# Patient Record
Sex: Female | Born: 1985 | Race: Black or African American | Hispanic: No | Marital: Single | Smoking: Never smoker
Health system: Southern US, Community
[De-identification: ages and names within clinical notes are randomized; demographics above are authoritative.]

## PROBLEM LIST (undated history)

## (undated) DIAGNOSIS — IMO0002 Reserved for concepts with insufficient information to code with codable children: Secondary | ICD-10-CM

## (undated) DIAGNOSIS — M329 Systemic lupus erythematosus, unspecified: Secondary | ICD-10-CM

---

## 2015-11-13 ENCOUNTER — Emergency Department (HOSPITAL_COMMUNITY): Payer: Managed Care, Other (non HMO)

## 2015-11-13 ENCOUNTER — Emergency Department (HOSPITAL_COMMUNITY)
Admission: EM | Admit: 2015-11-13 | Discharge: 2015-11-13 | Disposition: A | Discharge status: Home / Self Care | Payer: Managed Care, Other (non HMO) | Attending: Physician Assistant | Admitting: Physician Assistant

## 2015-11-13 ENCOUNTER — Encounter (HOSPITAL_COMMUNITY): Payer: Self-pay | Admitting: Nurse Practitioner

## 2015-11-13 DIAGNOSIS — Z3202 Encounter for pregnancy test, result negative: Secondary | ICD-10-CM | POA: Diagnosis not present

## 2015-11-13 DIAGNOSIS — R1011 Right upper quadrant pain: Secondary | ICD-10-CM | POA: Diagnosis not present

## 2015-11-13 DIAGNOSIS — R011 Cardiac murmur, unspecified: Secondary | ICD-10-CM | POA: Diagnosis not present

## 2015-11-13 DIAGNOSIS — R110 Nausea: Secondary | ICD-10-CM | POA: Insufficient documentation

## 2015-11-13 DIAGNOSIS — Z8739 Personal history of other diseases of the musculoskeletal system and connective tissue: Secondary | ICD-10-CM | POA: Insufficient documentation

## 2015-11-13 HISTORY — DX: Systemic lupus erythematosus, unspecified: M32.9

## 2015-11-13 HISTORY — DX: Reserved for concepts with insufficient information to code with codable children: IMO0002

## 2015-11-13 LAB — COMPREHENSIVE METABOLIC PANEL
ALT: 17 U/L (ref 14–54)
AST: 20 U/L (ref 15–41)
Albumin: 2.7 g/dL — ABNORMAL LOW (ref 3.5–5.0)
Alkaline Phosphatase: 88 U/L (ref 38–126)
Anion gap: 10 (ref 5–15)
BUN: 8 mg/dL (ref 6–20)
CALCIUM: 8.9 mg/dL (ref 8.9–10.3)
CHLORIDE: 102 mmol/L (ref 101–111)
CO2: 27 mmol/L (ref 22–32)
CREATININE: 0.81 mg/dL (ref 0.44–1.00)
Glucose, Bld: 98 mg/dL (ref 65–99)
Potassium: 3.6 mmol/L (ref 3.5–5.1)
Sodium: 139 mmol/L (ref 135–145)
Total Bilirubin: 0.4 mg/dL (ref 0.3–1.2)
Total Protein: 7.8 g/dL (ref 6.5–8.1)

## 2015-11-13 LAB — URINALYSIS, ROUTINE W REFLEX MICROSCOPIC
Bilirubin Urine: NEGATIVE
Glucose, UA: NEGATIVE mg/dL
Hgb urine dipstick: NEGATIVE
Ketones, ur: NEGATIVE mg/dL
Nitrite: NEGATIVE
PROTEIN: 30 mg/dL — AB
Specific Gravity, Urine: 1.016 (ref 1.005–1.030)
pH: 5 (ref 5.0–8.0)

## 2015-11-13 LAB — CBC
HCT: 27.4 % — ABNORMAL LOW (ref 36.0–46.0)
Hemoglobin: 8.8 g/dL — ABNORMAL LOW (ref 12.0–15.0)
MCH: 21.8 pg — ABNORMAL LOW (ref 26.0–34.0)
MCHC: 32.1 g/dL (ref 30.0–36.0)
MCV: 68 fL — AB (ref 78.0–100.0)
PLATELETS: 596 10*3/uL — AB (ref 150–400)
RBC: 4.03 MIL/uL (ref 3.87–5.11)
RDW: 16.4 % — ABNORMAL HIGH (ref 11.5–15.5)
WBC: 6.3 10*3/uL (ref 4.0–10.5)

## 2015-11-13 LAB — URINE MICROSCOPIC-ADD ON
RBC / HPF: NONE SEEN RBC/hpf (ref 0–5)
SQUAMOUS EPITHELIAL / LPF: NONE SEEN

## 2015-11-13 LAB — I-STAT BETA HCG BLOOD, ED (MC, WL, AP ONLY)

## 2015-11-13 MED ORDER — OXYCODONE-ACETAMINOPHEN 5-325 MG PO TABS
1.0000 | ORAL_TABLET | Freq: Once | ORAL | Status: AC
  Administered 2015-11-13: 1 via ORAL
  Filled 2015-11-13: qty 1

## 2015-11-13 MED ORDER — ONDANSETRON HCL 4 MG PO TABS: 4.0000 mg | ORAL_TABLET | Freq: Three times a day (TID) | ORAL | Status: DC | PRN

## 2015-11-13 MED ORDER — CEPHALEXIN 500 MG PO CAPS: 500.0000 mg | ORAL_CAPSULE | Freq: Four times a day (QID) | ORAL | Status: DC

## 2015-11-13 MED ORDER — OXYCODONE-ACETAMINOPHEN 5-325 MG PO TABS: 1.0000 | ORAL_TABLET | Freq: Four times a day (QID) | ORAL | Status: DC | PRN

## 2015-11-13 MED ORDER — CEPHALEXIN 250 MG PO CAPS
500.0000 mg | ORAL_CAPSULE | Freq: Once | ORAL | Status: AC
  Administered 2015-11-13: 500 mg via ORAL
  Filled 2015-11-13: qty 2

## 2015-11-13 NOTE — ED Notes (Signed)
Pt sent here from urgent care dx: with UTI.

## 2015-11-13 NOTE — ED Notes (Signed)
She c/o constant R sided abd pain since Thursday with intermittent nausea. Denies fevers, vomiting, bowel/bladder changes. Pain aggravated with movement, palpation. Pain decreased with sitting still. She is A&Ox4, resp e/u

## 2015-11-13 NOTE — Discharge Instructions (Signed)
You do have evidence of a UTI which we will treat today.    You were found to have gallbladder sludge.  No infection or stones. Please return if you have a fever, increased pain.  Call central surgery to follow up.    Please follow up with your PCP as well.

## 2015-11-13 NOTE — ED Provider Notes (Signed)
CSN: [id number]     Arrival date & time 11/13/15  1454 History   First MD Initiated Contact with Patient 11/13/15 1723     Chief Complaint  Patient presents with  . Abdominal Pain     (Consider location/radiation/quality/duration/timing/severity/associated sxs/prior Treatment) HPI   Patient's very friendly 30 year old female with history of lupus on plaque on  presenting with right upper quadrant pain. Patient reports that she's had decreased appetite for the last 2 weeks, increased nausea for last week, and right upper quadrant pain for last 2 days. She went to urgent care center here for an ultrasound of her right upper quadrant.  Patient has not found either eating or not eating helpful. Patient does note that the pain is worse with cough or movement. She reports she is trying to stay stills that she doesn't have to move her muscles which causes her pain. In addition it hurts her when she coughs  Past Medical History  Diagnosis Date  . Lupus ([place])    History reviewed. No pertinent past surgical history. History reviewed. No pertinent family history. Social History  Substance Use Topics  . Smoking status: Never Smoker   . Smokeless tobacco: None  . Alcohol Use: Yes   OB History    No data available     Review of Systems  Constitutional: Negative for fever and activity change.  Respiratory: Negative for shortness of breath.   Cardiovascular: Negative for chest pain.  Gastrointestinal: Positive for nausea and abdominal pain. Negative for vomiting.  Genitourinary: Negative for dysuria and flank pain.  Musculoskeletal: Negative for back pain.  Neurological: Negative for dizziness.      Allergies  Review of patient's allergies indicates no known allergies.  Home Medications   Prior to Admission medications   Not on File   BP 120/80 mmHg  Pulse 100  Temp(Src) 97.9 F (36.6 C) (Oral)  Resp 18  SpO2 99%  LMP 11/09/2015 Physical Exam  Constitutional: She is  oriented to person, place, and time. She appears well-developed and well-nourished.  HENT:  Head: Normocephalic and atraumatic.  Eyes: Conjunctivae are normal. Right eye exhibits no discharge.  Neck: Neck supple.  Cardiovascular: Normal rate and regular rhythm.   Murmur heard. Pulmonary/Chest: Effort normal and breath sounds normal. She has no wheezes. She has no rales.  Abdominal: Soft. She exhibits no distension.  Right upper quadrant tenderness.  Musculoskeletal: Normal range of motion. She exhibits no edema.  Neurological: She is oriented to person, place, and time. No cranial nerve deficit.  Skin: Skin is warm and dry. No rash noted. She is not diaphoretic.  Psychiatric: She has a normal mood and affect. Her behavior is normal.  Nursing note and vitals reviewed.   ED Course  Procedures (including critical care time) Labs Review Labs Reviewed  COMPREHENSIVE METABOLIC PANEL - Abnormal; Notable for the following:    Albumin 2.7 (*)    All other components within normal limits  CBC - Abnormal; Notable for the following:    Hemoglobin 8.8 (*)    HCT 27.4 (*)    MCV 68.0 (*)    MCH 21.8 (*)    RDW 16.4 (*)    Platelets 596 (*)    All other components within normal limits  URINALYSIS, ROUTINE W REFLEX MICROSCOPIC (NOT AT [facility]) - Abnormal; Notable for the following:    APPearance CLOUDY (*)    Protein, ur 30 (*)    Leukocytes, UA MODERATE (*)    All other components within  normal limits  URINE MICROSCOPIC-ADD ON - Abnormal; Notable for the following:    Bacteria, UA FEW (*)    All other components within normal limits  I-STAT BETA HCG BLOOD, ED ([facility], [facility], [facility] ONLY)    Imaging Review No results found. I have personally reviewed and evaluated these images and lab results as part of my medical decision-making.   EKG Interpretation None      MDM   Final diagnoses:  RUQ pain    Patient is a 30 year old female lupus on plaque in the presenting with right upper quadrant  pain. Patient reports this developed in the last 3 days. She does not think it's worse or better with eating. Patient does have tenderness to the right upper quadrant on exam. She denies chest pain. The way that she describes the pain is worse with movement. We'll make sure the patient does not have occult pneumonia, and do right upper quadrant ultrasound.     EMERGENCY DEPARTMENT BILIARY ULTRASOUND INTERPRETATION "Study: Limited Abdominal Ultrasound of the gallbladder and common bile duct."  INDICATIONS: RUQ pain Indication: Multiple views of the gallbladder and common bile duct were obtained in real-time with a Multi-frequency probe." PERFORMED BY:  Myself IMAGES ARCHIVED?: Yes FINDINGS: Gallstones absent INTERPRETATION: Normal   6:44 PM Official RUQ negative. CXR negative. Labs show no infection.  UA shows UTI.  Will treat UTI and have her follow up with surgery as needed.   She is taking PO, ambulatory, normal vitals.   Mairely Foxworth Julio Alm, MD 11/13/15 [phone]

## 2015-11-13 NOTE — ED Notes (Signed)
Patient transported to Ultrasound 

## 2015-12-26 ENCOUNTER — Other Ambulatory Visit (HOSPITAL_COMMUNITY): Payer: Self-pay | Admitting: Internal Medicine

## 2015-12-26 DIAGNOSIS — R0602 Shortness of breath: Secondary | ICD-10-CM

## 2016-01-06 ENCOUNTER — Ambulatory Visit (HOSPITAL_COMMUNITY)
Admission: RE | Admit: 2016-01-06 | Discharge: 2016-01-06 | Disposition: A | Discharge status: Home / Self Care | Payer: Managed Care, Other (non HMO) | Source: Ambulatory Visit | Attending: Internal Medicine | Admitting: Internal Medicine

## 2016-01-06 DIAGNOSIS — R918 Other nonspecific abnormal finding of lung field: Secondary | ICD-10-CM | POA: Diagnosis not present

## 2016-01-06 DIAGNOSIS — J90 Pleural effusion, not elsewhere classified: Secondary | ICD-10-CM | POA: Diagnosis not present

## 2016-01-06 DIAGNOSIS — R0602 Shortness of breath: Secondary | ICD-10-CM

## 2016-01-06 DIAGNOSIS — R599 Enlarged lymph nodes, unspecified: Secondary | ICD-10-CM | POA: Insufficient documentation

## 2016-01-31 ENCOUNTER — Encounter: Payer: Self-pay | Admitting: Hematology

## 2016-02-02 ENCOUNTER — Telehealth: Payer: Self-pay | Admitting: Hematology

## 2016-02-02 NOTE — Telephone Encounter (Signed)
Patient called this morning returning a call from our office regarding an appointment. Spoke with patient re seeing [facility] 5/4 - per patient appointment moved to 5/18 (date per patient). Patient has new appointment date/time/location. Patient demographic and insurance information confirmed. Forwarded back to [facility] to close out new patient process.

## 2016-02-09 ENCOUNTER — Ambulatory Visit: Payer: Managed Care, Other (non HMO) | Admitting: Hematology

## 2016-02-23 ENCOUNTER — Ambulatory Visit (HOSPITAL_BASED_OUTPATIENT_CLINIC_OR_DEPARTMENT_OTHER): Payer: Managed Care, Other (non HMO) | Admitting: Hematology

## 2016-02-23 ENCOUNTER — Telehealth: Payer: Self-pay | Admitting: Hematology

## 2016-02-23 ENCOUNTER — Encounter: Payer: Self-pay | Admitting: Hematology

## 2016-02-23 ENCOUNTER — Other Ambulatory Visit (HOSPITAL_BASED_OUTPATIENT_CLINIC_OR_DEPARTMENT_OTHER): Payer: Managed Care, Other (non HMO)

## 2016-02-23 VITALS — BP 124/71 | HR 87 | Temp 98.2°F | Resp 16 | Ht 64.0 in | Wt 112.4 lb

## 2016-02-23 DIAGNOSIS — E538 Deficiency of other specified B group vitamins: Secondary | ICD-10-CM

## 2016-02-23 DIAGNOSIS — M329 Systemic lupus erythematosus, unspecified: Secondary | ICD-10-CM | POA: Insufficient documentation

## 2016-02-23 DIAGNOSIS — D509 Iron deficiency anemia, unspecified: Secondary | ICD-10-CM

## 2016-02-23 DIAGNOSIS — D638 Anemia in other chronic diseases classified elsewhere: Secondary | ICD-10-CM | POA: Diagnosis not present

## 2016-02-23 DIAGNOSIS — D75839 Thrombocytosis, unspecified: Secondary | ICD-10-CM

## 2016-02-23 DIAGNOSIS — D473 Essential (hemorrhagic) thrombocythemia: Secondary | ICD-10-CM

## 2016-02-23 LAB — CBC & DIFF AND RETIC
BASO%: 0.1 % (ref 0.0–2.0)
Basophils Absolute: 0 10*3/uL (ref 0.0–0.1)
EOS%: 0.1 % (ref 0.0–7.0)
Eosinophils Absolute: 0 10*3/uL (ref 0.0–0.5)
HEMATOCRIT: 38.1 % (ref 34.8–46.6)
HGB: 12.2 g/dL (ref 11.6–15.9)
Immature Retic Fract: 3.4 % (ref 1.60–10.00)
LYMPH#: 3.7 10*3/uL — AB (ref 0.9–3.3)
LYMPH%: 26.3 % (ref 14.0–49.7)
MCH: 23.9 pg — ABNORMAL LOW (ref 25.1–34.0)
MCHC: 32 g/dL (ref 31.5–36.0)
MCV: 74.6 fL — ABNORMAL LOW (ref 79.5–101.0)
MONO#: 0.8 10*3/uL (ref 0.1–0.9)
MONO%: 5.9 % (ref 0.0–14.0)
NEUT%: 67.6 % (ref 38.4–76.8)
NEUTROS ABS: 9.5 10*3/uL — AB (ref 1.5–6.5)
Platelets: 383 10*3/uL (ref 145–400)
RBC: 5.11 10*6/uL (ref 3.70–5.45)
RDW: 23.2 % — ABNORMAL HIGH (ref 11.2–14.5)
RETIC %: 1.52 % (ref 0.70–2.10)
RETIC CT ABS: 77.67 10*3/uL (ref 33.70–90.70)
WBC: 14 10*3/uL — ABNORMAL HIGH (ref 3.9–10.3)
nRBC: 0 % (ref 0–0)

## 2016-02-23 LAB — COMPREHENSIVE METABOLIC PANEL
ALT: 16 U/L (ref 0–55)
AST: 13 U/L (ref 5–34)
Albumin: 3.6 g/dL (ref 3.5–5.0)
Alkaline Phosphatase: 66 U/L (ref 40–150)
Anion Gap: 6 mEq/L (ref 3–11)
BUN: 13.7 mg/dL (ref 7.0–26.0)
CHLORIDE: 104 meq/L (ref 98–109)
CO2: 29 meq/L (ref 22–29)
CREATININE: 0.8 mg/dL (ref 0.6–1.1)
Calcium: 9.5 mg/dL (ref 8.4–10.4)
EGFR: >90 mL/min/{1.73_m2} (ref >90)
Glucose: 71 mg/dl (ref 70–140)
Potassium: 3.6 mEq/L (ref 3.5–5.1)
Sodium: 138 mEq/L (ref 136–145)
Total Bilirubin: <0.3 mg/dL (ref 0.20–1.20)
Total Protein: 7.6 g/dL (ref 6.4–8.3)

## 2016-02-23 LAB — CHCC SMEAR

## 2016-02-23 LAB — LACTATE DEHYDROGENASE: LDH: 184 U/L (ref 125–245)

## 2016-02-23 NOTE — Telephone Encounter (Signed)
Gave and printed avs °

## 2016-02-23 NOTE — Progress Notes (Signed)
[facility]    HEMATOLOGY/ONCOLOGY CONSULTATION NOTE  Date of Service: 02/23/2016  PCP: Dr. Lujean Amel MD [facility] Rheumatology: Dr. Delphia Grates at [place]:  Microcytic anemia  HISTORY OF PRESENTING ILLNESS:   Janet Pittman is a wonderful 30 y.o. female who has been referred to [place] by Dr. Delphia Grates for evaluation and management of microcytic anemia.  Patient is a Pharmacist, community with a history of SLE diagnosed in 2002. She had been on Plaquenil since initial presentation with arthralgias and rashes. Recently had developed pleurisy and has been on prednisone 40 mg by mouth daily. She was also taking ibuprofen 1 tablet 2-3 times a day for several months and stopped about a month ago. She reports that her rheumatologist is to start her on Benlysta for the treatment of her SLE soon she reports no history of lupus nephritis or significant anemia related to her SLE in the past.   she had a recent labs with her primary care physician when she presented with significant fatigue and was noted to have a hemoglobin of 7.8 with an MCV of 65 normal WBC count of 8.7 k and platelets of 662 k. Her iron saturation was noted to be 7% with a decreased total iron binding capacity suggestive of iron deficiency and likely anemia of chronic disease .   she notes that she was started on ferrous fumarate 1 tablet by mouth twice a day about a month ago and prednisone 40 mg by mouth daily about the same time and has been feeling better.  She notes no overt bleeding or abdominal pain. Bowel movements have been regular with no blood in the stools or black stools. Her periods have been borderline heavy but not concerning to her.  Previously noted to have B12 deficiency. Unclear etiology. Has been on oral B12 replacement.  She notes that a couple months ago she lost a fair amount of weight when she was feeling unwell but is starting to gain her weight back and has been feeling quite hungry  with the prednisone on board.  No other acute new focal symptoms at this time.    MEDICAL HISTORY:   #1 systemic lupus erythematosus diagnosed in 2002 was on Plaquenil treatment. He initially presented with arthralgias and rashes. Has recently developed pleurisy for which she has been on prednisone 40 mg by mouth daily for the last 1 month. She reports that her rheumatologist is going to be starting her on Benlysta. #2 history of B12 deficiency #3 iron deficiency -currently on oral ferrous fumarate 1 tablet by mouth twice a day. #4 history of vitamin D deficiency currently on ergocalciferol.  SURGICAL HISTORY: No previous surgeries  SOCIAL HISTORY: Social History   Social History  . Marital Status: Single    Spouse Name: N/A  . Number of Children: N/A  . Years of Education: N/A   Occupational History  . Not on file.   Social History Main Topics  . Smoking status: Never Smoker   . Smokeless tobacco: Not on file  . Alcohol Use: Yes  . Drug Use: No  . Sexual Activity: No   Other Topics Concern  . Not on file   Social History Narrative  Works as a Pharmacist, community in [place]. Never smoker. Minimal social alcohol use. No recreational drug use.   FAMILY HISTORY: Patient reports that she was adopted and does not know her family history  ALLERGIES:  has No Known Allergies.  MEDICATIONS:  Current Outpatient Prescriptions  Medication  Sig Dispense Refill  . Ferrous Fumarate (FERROCITE) 324 (106 Fe) MG TABS tablet     . hydroxychloroquine (PLAQUENIL) 200 MG tablet     . predniSONE (DELTASONE) 20 MG tablet Take by mouth.    . Vitamin D, Ergocalciferol, (DRISDOL) 50000 units CAPS capsule Take by mouth.     No current facility-administered medications for this visit.    REVIEW OF SYSTEMS:    10 Point review of Systems was done is negative except as noted above.  PHYSICAL EXAMINATION: ECOG PERFORMANCE STATUS: 1 - Symptomatic but completely ambulatory  . Filed Vitals:    02/23/16 1121  BP: 124/71  Pulse: 87  Temp: 98.2 F (36.8 C)  Resp: 16   Filed Weights   02/23/16 1121  Weight: 112 lb 6.4 oz (50.984 kg)   .Body mass index is 19.28 kg/(m^2).  GENERAL:thinly built, alert, in no acute distress and comfortable SKIN: skin color, texture, turgor are normal, no rashes or significant lesions EYES: normal, conjunctiva are pink and non-injected, sclera clear OROPHARYNX:no exudate, no erythema and lips, buccal mucosa, and tongue normal  NECK: supple, no JVD, thyroid normal size, non-tender, without nodularity LYMPH:  no palpable lymphadenopathy in the cervical, axillary or inguinal LUNGS: clear to auscultation with normal respiratory effort HEART: regular rate & rhythm,  no murmurs and no lower extremity edema ABDOMEN: abdomen soft, non-tender, normoactive bowel sounds  Musculoskeletal: no cyanosis of digits and no clubbing  PSYCH: alert & oriented x 3 with fluent speech NEURO: no focal motor/sensory deficits  LABORATORY DATA:  I have reviewed the data as listed  . CBC Latest Ref Rng 02/23/2016 11/13/2015  WBC 3.9 - 10.3 10e3/uL 14.0(H) 6.3  Hemoglobin 11.6 - 15.9 g/dL 12.2 8.8(L)  Hematocrit 34.8 - 46.6 % 38.1 27.4(L)  Platelets 145 - 400 10e3/uL 383 596(H)   . CBC    Component Value Date/Time   WBC 14.0* 02/23/2016 1232   WBC 6.3 11/13/2015 1608   RBC 5.11 02/23/2016 1232   RBC 4.03 11/13/2015 1608   HGB 12.2 02/23/2016 1232   HGB 8.8* 11/13/2015 1608   HCT 38.1 02/23/2016 1232   HCT 27.4* 11/13/2015 1608   PLT 383 02/23/2016 1232   PLT 596* 11/13/2015 1608   MCV 74.6* 02/23/2016 1232   MCV 68.0* 11/13/2015 1608   MCH 23.9* 02/23/2016 1232   MCH 21.8* 11/13/2015 1608   MCHC 32.0 02/23/2016 1232   MCHC 32.1 11/13/2015 1608   RDW 23.2* 02/23/2016 1232   RDW 16.4* 11/13/2015 1608   LYMPHSABS 3.7* 02/23/2016 1232   MONOABS 0.8 02/23/2016 1232   EOSABS 0.0 02/23/2016 1232   BASOSABS 0.0 02/23/2016 1232     . CMP Latest Ref Rng  11/13/2015  Glucose 65 - 99 mg/dL 98  BUN 6 - 20 mg/dL 8  Creatinine 0.44 - 1.00 mg/dL 0.81  Sodium 135 - 145 mmol/L 139  Potassium 3.5 - 5.1 mmol/L 3.6  Chloride 101 - 111 mmol/L 102  CO2 22 - 32 mmol/L 27  Calcium 8.9 - 10.3 mg/dL 8.9  Total Protein 6.5 - 8.1 g/dL 7.8  Total Bilirubin 0.3 - 1.2 mg/dL 0.4  Alkaline Phos 38 - 126 U/L 88  AST 15 - 41 U/L 20  ALT 14 - 54 U/L 17     RADIOGRAPHIC STUDIES: I have personally reviewed the radiological images as listed and agreed with the findings in the report. No results found.  ASSESSMENT & PLAN:   30 year old female with a known history of systemic lupus  erythematosus with  #1 Severe microcytic hypochromic anemia due to iron deficiency + Anemia of chronic disease. Her hemoglobin on 01/13/2016 was 7.8 with an MCV of 65. She was started on ferrous fumarate 1 tablet by mouth twice a day and has had resolution of her anemia with her hemoglobin today being 12.2. Her MCV has improved to 74.6. Patient has also been on prednisone 40 mg by mouth daily which likely has helped inflammation from her SLE. The etiology of the iron deficiency is unclear. She appears to be absorbing iron well orally rules out a significant absorption issue.  Cannot rule out some slow GI losses due to significant use of NSAIDs in the past. Her periods also appeared to be heavier than she might be suggesting.  #2 history of vitamin B12 deficiency #3 Systemic lupus erythematosus - with details as noted about. Currently on plaquenil. Recent pleurisy and was on prednisone 40 mg by mouth daily. Is being managed by Dr. Franki Monte from [facility].  #4 from monocytosis likely due to iron deficiency has resolved with iron replacement. Plan - Patient's anemia has nearly resolved with oral iron replacement and she is feeling much better . -No indication for IV iron at this time . -Would recommend she continue her current oral iron replacement and have her CBC and ferritin levels  rechecked with her primary care physician in 2 months . If her ferritin levels are more than 50 she could go to a maintenance of 1 tablet of her ferrous fumarate daily .  -Would recommend urinalysis with micorscopy to check for hematuria in the setting of SLE to rule out lupus nephritis. -Will defer to primary care physician regarding the need for GI workup to evaluate etiology of fine deficiency and B12 deficiency. -Recommend that she switch her B12 to sublingual form over the counter at 1037mcg daily. - Await levels from the labs today. -Follow-up pending iron labs from today. -We'll check antiparietal cell antibody and anti-intrinsic factor antibody to rule out pernicious anemia in the setting of known history of SLE.   -Continue follow-up with primary care physician and rheumatology. Return to care with Dr. Irene Limbo on an as-needed basis if any new questions or concerns arise .  All of the patients questions were answered with apparent satisfaction. The patient knows to call the clinic with any problems, questions or concerns.  I spent 60 minutes counseling the patient face to face. The total time spent in the appointment was 60 minutes and more than 50% was on counseling and direct patient cares.    Sullivan Lone MD [place] AAHIVMS [facility] Hematology/Oncology Physician [facility]  (Office):       [phone] (Work cell):  [phone] (Fax):           [phone]  02/23/2016 12:48 PM

## 2016-02-24 LAB — IRON AND TIBC CHCC
Iron Saturation: 34 % (ref 15–55)
Iron: 91 ug/dL (ref 27–159)
TIBC: 266 ug/dL (ref 250–450)
UIBC: 175 ug/dL (ref 131–425)

## 2016-02-24 LAB — HAPTOGLOBIN: HAPTOGLOBIN: 153 mg/dL (ref 34–200)

## 2016-02-24 LAB — ANTI-PARIETAL ANTIBODY: PARIETAL CELL AB: 15.5 U (ref 0.0–20.0)

## 2016-02-24 LAB — FERRITIN CHCC: Ferritin: 42 ng/mL (ref 15–150)

## 2016-02-24 LAB — INTRINSIC FACTOR ANTIBODIES: INTRINSIC FACTOR ABS, SERUM: 1 [AU]/ml (ref 0.0–1.1)

## 2016-02-24 LAB — VITAMIN B12: Vitamin B12: 368 pg/mL (ref 211–946)

## 2016-02-24 LAB — SEDIMENTATION RATE: SED RATE: 16 mm/h (ref 0–32)

## 2016-11-20 ENCOUNTER — Ambulatory Visit: Payer: Managed Care, Other (non HMO) | Admitting: Podiatry

## 2016-11-30 ENCOUNTER — Encounter: Payer: Self-pay | Admitting: Podiatry

## 2016-11-30 ENCOUNTER — Ambulatory Visit (INDEPENDENT_AMBULATORY_CARE_PROVIDER_SITE_OTHER): Payer: 59 | Admitting: Podiatry

## 2016-11-30 VITALS — BP 120/82 | HR 116 | Resp 16 | Ht 64.0 in | Wt 135.0 lb

## 2016-11-30 DIAGNOSIS — M2040 Other hammer toe(s) (acquired), unspecified foot: Secondary | ICD-10-CM

## 2016-11-30 DIAGNOSIS — L600 Ingrowing nail: Secondary | ICD-10-CM

## 2016-11-30 NOTE — Patient Instructions (Signed)

## 2016-11-30 NOTE — Progress Notes (Signed)
   Subjective:    Patient ID: Janet Pittman, female    DOB: September 03, 1986, 31 y.o.   MRN: [id number]  HPI Chief Complaint  Patient presents with  . Nail Problem    Right foot; great toe-lateral side; pt stated, "Pain started last summer; pus has come out of nail; had an antibiotic in January 2018 and a topical antibiotic-both did not help"      Review of Systems  Musculoskeletal: Positive for joint swelling.  All other systems reviewed and are negative.      Objective:   Physical Exam        Assessment & Plan:

## 2016-12-01 NOTE — Progress Notes (Signed)
Subjective:     Patient ID: Janet Pittman, female   DOB: Aug 11, 1986, 31 y.o.   MRN: [id number]  HPI patient presents with chronic ingrown toenail deformity right hallux lateral border that's painful when pressed and making shoe gear difficult. States she's tried to soak it she's had previous antibiotics and trimming without relief   Review of Systems  All other systems reviewed and are negative.      Objective:   Physical Exam  Constitutional: She is oriented to person, place, and time.  Cardiovascular: Intact distal pulses.   Musculoskeletal: Normal range of motion.  Neurological: She is oriented to person, place, and time.  Skin: Skin is warm.  Nursing note and vitals reviewed.  Neurovascular status intact muscle strength adequate range of motion within normal limits with patient found to have incurvated right hallux lateral border that's painful when pressed and makes shoe gear difficult. There is distal redness but no active drainage and it is painful when palpated with patient found to have good digital perfusion and well oriented 3     Assessment:     Ingrown toenail deformity right hallux lateral border that's painful    Plan:     H&P conditions reviewed recommended correction of the corner with wrist discussed with patient. I infiltrated the right hallux 60 mg like Marcaine mixture remove the lateral border exposed matrix and applied phenol 3 applications 30 seconds followed by alcohol lavage and sterile dressing. Gave instructions on soaks and reappoint

## 2017-01-08 IMAGING — CT CT CHEST HIGH RESOLUTION W/O CM
2 of 5 series · 15 of 36 positions shown, 18 images · non-contrast
Comparison: No priors.

CLINICAL DATA: 30-year-old female with history of shortness of
breath for 1 year.

EXAM:
CT CHEST WITHOUT CONTRAST
TECHNIQUE: Multidetector CT imaging of the chest was performed following the
standard protocol without intravenous contrast. High resolution
imaging of the lungs, as well as inspiratory and expiratory imaging,
was performed.

[Series 2: chest with st · axial · 0.63mm/px · z∈[-284,-44]mm · 12 of 54 slices shown, 15 images]
[im 3/54  mediastinal]
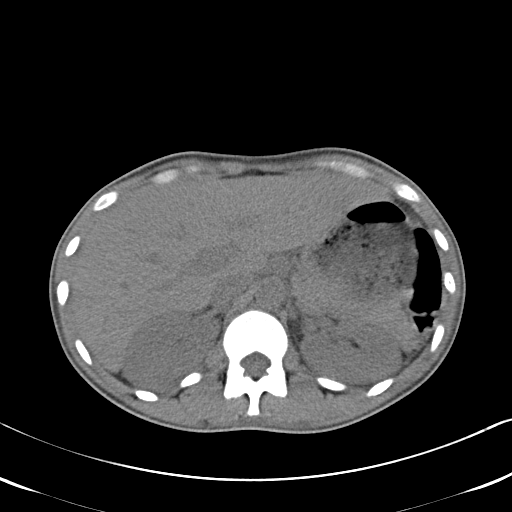
[im 3/54  lung]
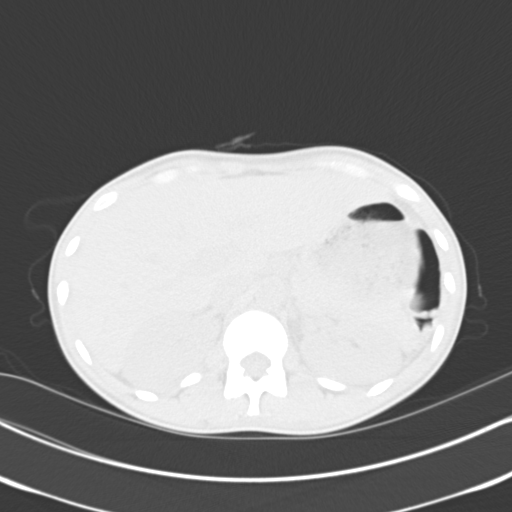
[im 9/54  lung]
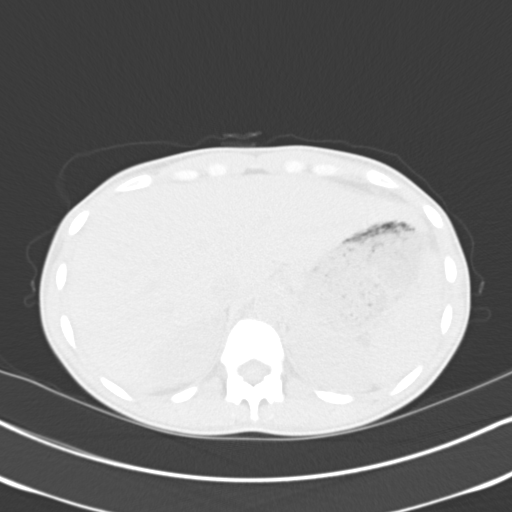
[im 12/54  lung]
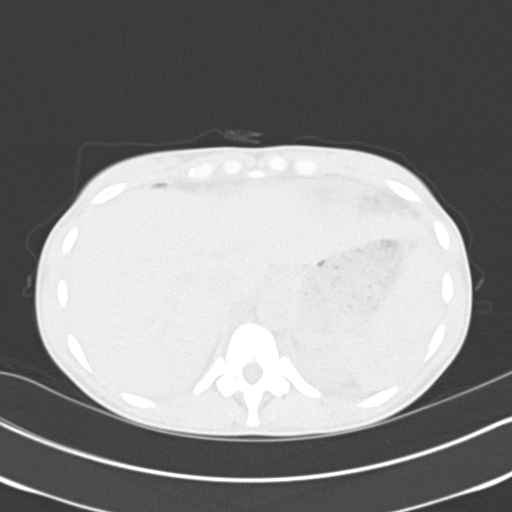
[im 17/54  lung]
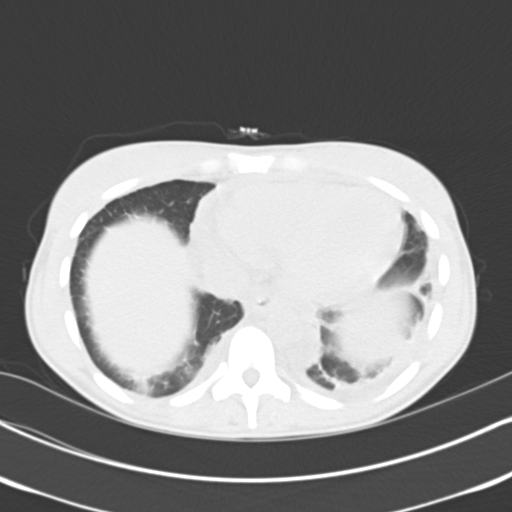
[im 20/54  mediastinal]
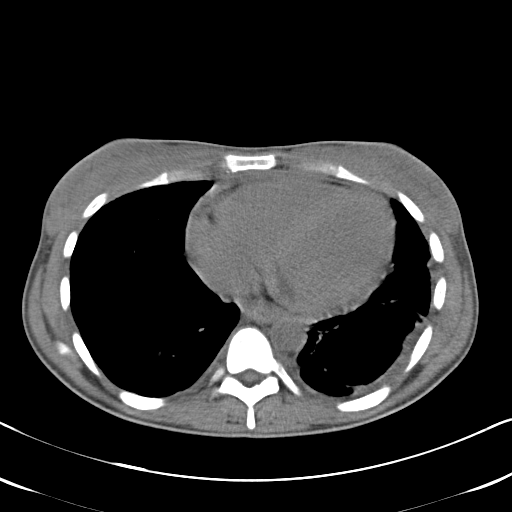
[im 20/54  lung]
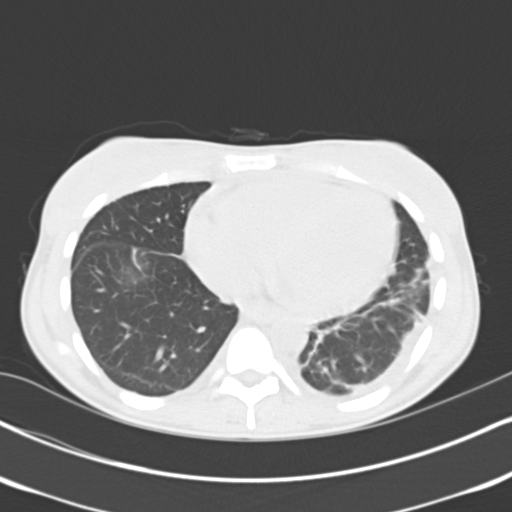
[im 26/54  lung]
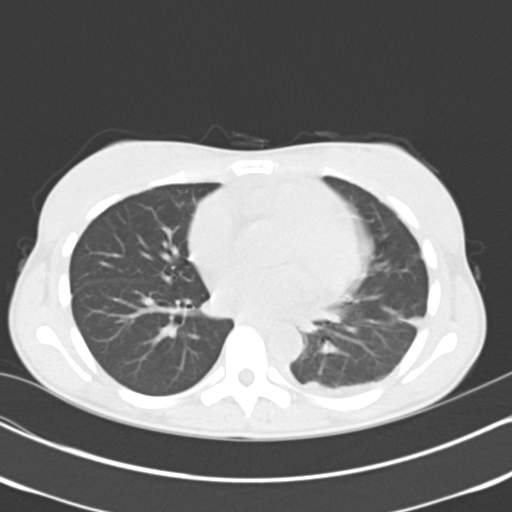
[im 28/54  lung]
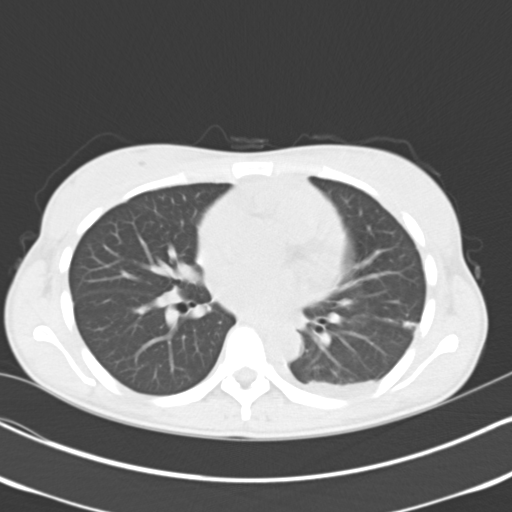
[im 34/54  lung]
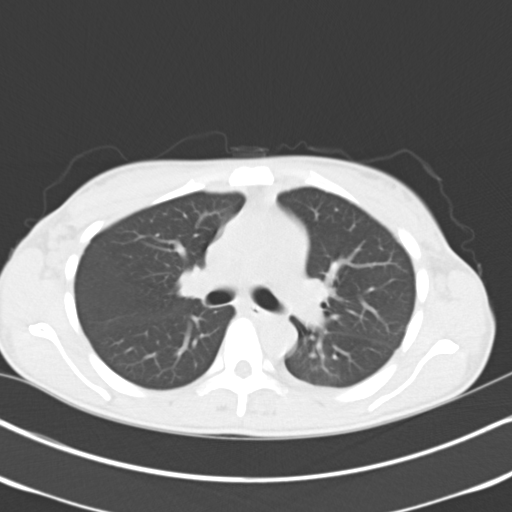
[im 37/54  mediastinal]
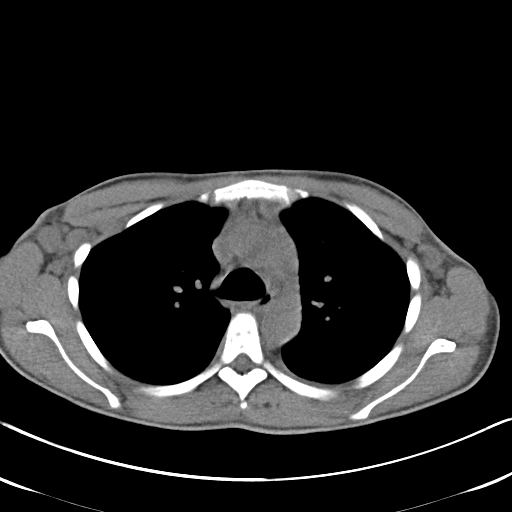
[im 37/54  lung]
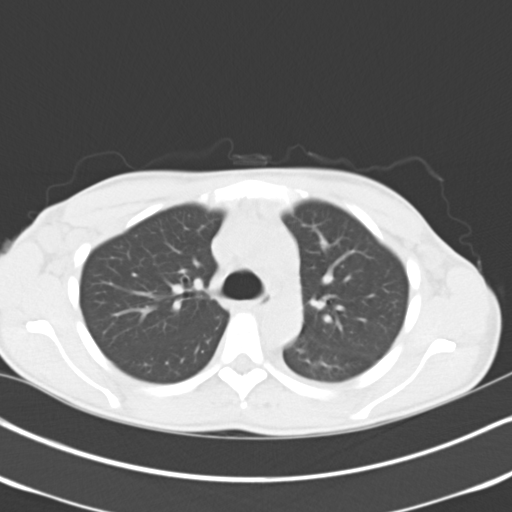
[im 42/54  lung]
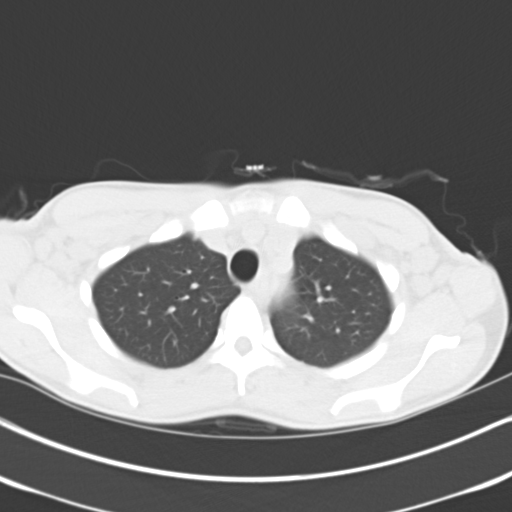
[im 45/54  lung]
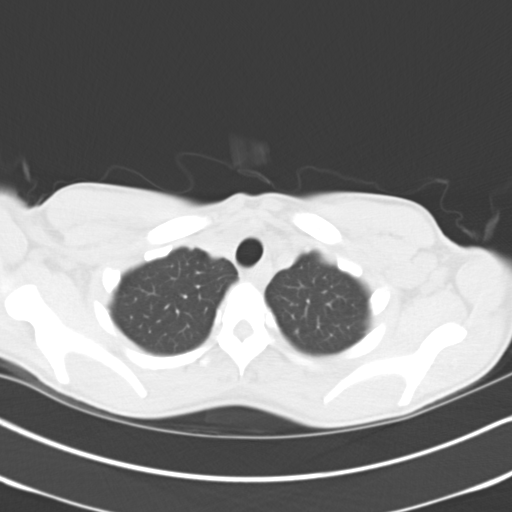
[im 51/54  lung]
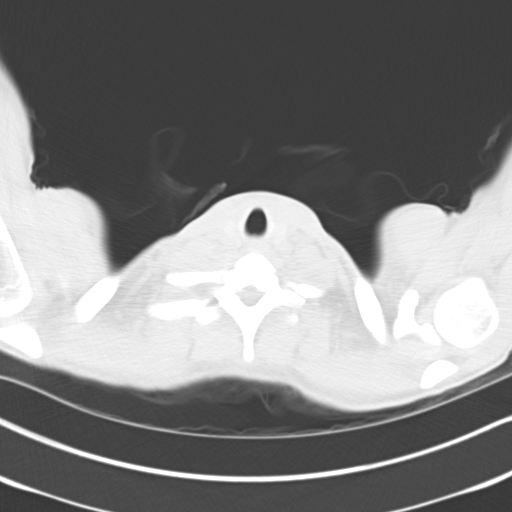

[Series 602: <mpr thick range> · coronal · 0.63mm/px · 3 of 103 slices shown]
[im 21/103  lung]
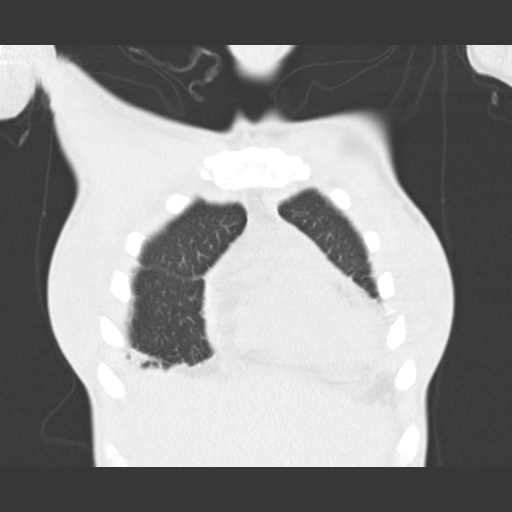
[im 41/103  lung]
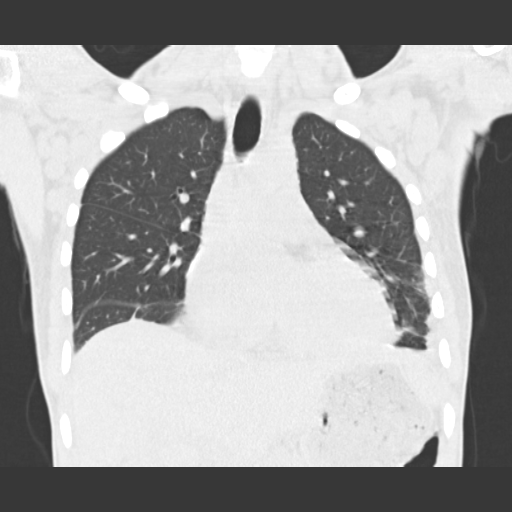
[im 62/103  lung]
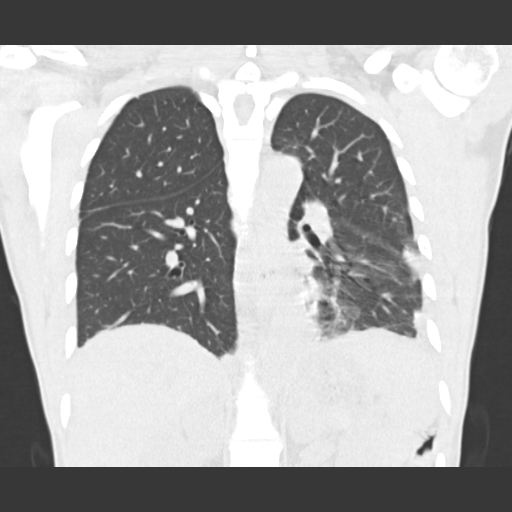

[15 of 36 positions shown; findings below may reference images not displayed]

FINDINGS: Mediastinum/Lymph Nodes: Heart size is normal. There is no
significant pericardial fluid, thickening or pericardial
calcification. No pathologically enlarged mediastinal or hilar lymph
nodes. Please note that accurate exclusion of hilar adenopathy is
limited on noncontrast CT scans. Esophagus is unremarkable in
appearance. Multiple enlarged axillary lymph nodes bilaterally
measuring up to 14 mm in short axis on the left and 16 mm in short
axis on the right.

Lungs/Pleura: Trace amount of pleural fluid bilaterally (left
greater than right) with some left-sided pleural thickening. Linear
opacities in the base of the lungs bilaterally (left greater than
right) favored to reflect areas of subsegmental atelectasis or
scarring. In addition, there is very mild interlobular septal
thickening that is most apparent the lung bases, favored to reflect
some mild interstitial pulmonary edema. High-resolution images
otherwise demonstrate no significant regions of ground-glass
attenuation, subpleural reticulation, traction bronchiectasis or
frank honeycombing. Inspiratory and expiratory imaging is
unremarkable. No acute consolidative airspace disease. No suspicious
appearing pulmonary nodules or masses.

Upper abdomen: Unremarkable.

Musculoskeletal: There are no aggressive appearing lytic or blastic
lesions noted in the visualized portions of the skeleton.
IMPRESSION: 1. Trace bilateral pleural effusions with some left basilar pleural
thickening. Increased septal thickening in that extreme lung bases
bilaterally. These findings are of uncertain etiology and
significance. The possibility of interstitial lung disease is not
excluded, but is not strongly favored on the basis of these
findings. If there is persistent clinical concern for interstitial
lung disease in the future, repeat high-resolution chest CT should
be obtained in 6-12 months to assess for further temporal changes in
the appearance of the lung parenchyma.
2. Multiple enlarged bilateral axillary lymph nodes. Clinical
correlation for signs and symptoms of lymphoproliferative disorder
is recommended.
These results will be called to the ordering clinician or
representative by the Radiologist Assistant, and communication
documented in the PACS or zVision Dashboard.

## 2017-05-22 ENCOUNTER — Other Ambulatory Visit: Payer: Self-pay | Admitting: Family Medicine

## 2017-05-22 ENCOUNTER — Ambulatory Visit
Admission: RE | Admit: 2017-05-22 | Discharge: 2017-05-22 | Disposition: A | Discharge status: Home / Self Care | Payer: Self-pay | Source: Ambulatory Visit | Attending: Family Medicine | Admitting: Family Medicine

## 2017-05-22 DIAGNOSIS — R059 Cough, unspecified: Secondary | ICD-10-CM

## 2017-05-22 DIAGNOSIS — R05 Cough: Secondary | ICD-10-CM

## 2017-08-02 ENCOUNTER — Encounter: Payer: Self-pay | Admitting: Emergency Medicine

## 2017-08-02 ENCOUNTER — Ambulatory Visit (INDEPENDENT_AMBULATORY_CARE_PROVIDER_SITE_OTHER): Payer: BLUE CROSS/BLUE SHIELD | Admitting: Emergency Medicine

## 2017-08-02 DIAGNOSIS — R05 Cough: Secondary | ICD-10-CM

## 2017-08-02 DIAGNOSIS — R053 Chronic cough: Secondary | ICD-10-CM | POA: Insufficient documentation

## 2017-08-02 NOTE — Patient Instructions (Addendum)
It sounds like your cough may be related to esophageal reflux. If your cough returns please try omeprazole 20 mg twice a day.  Take this medication about 30 minutes before eating. Follow-up with [facility] Chest as planned.  If your cough continues even after treatment of common sustaining problems like reflux or nasal drainage then they may decide to do further evaluation.  This could include repeat chest scanning, possible bronchoscopy to inspect your airways. Continue your Plaquenil, lupus medications as managed by Dr. Franki Monte Follow with Dr Lamonte Sakai if needed

## 2017-08-02 NOTE — Assessment & Plan Note (Signed)
A review of her prior evaluation and [place] chest shows some restrictive disease but no evidence for obstruction on her prior pulmonary function testing.  She had a chest x-ray 05/22/17 that did not show any interstitial disease.  Based on her history, the off and on nature of her cough, I suspect that this is related to esophageal reflux.  Suspect that her cough will return if the reflux continues.  Empiric therapy would be reasonable at that time.  If she does not respond to treatment for the typical standards of chronic cough then she would merit further evaluation including CT chest to look for evolving infectious process, interstitial disease.  Possible bronchoscopy to evaluate airways.  She will pick up the evaluation with [facility] when she sees them.   It sounds like your cough may be related to esophageal reflux. If your cough returns please try omeprazole 20 mg twice a day.  Take this medication about 30 minutes before eating. Follow-up with [facility] Chest as planned.  If your cough continues even after treatment of common sustaining problems like reflux or nasal drainage then they may decide to do further evaluation.  This could include repeat chest scanning, possible bronchoscopy to inspect your airways. Continue your Plaquenil, lupus medications as managed by Dr. Franki Monte Follow with Dr Lamonte Sakai if needed

## 2017-08-02 NOTE — Progress Notes (Signed)
Subjective:    Patient ID: Janet Pittman, female    DOB: 10-16-1985, 31 y.o.   MRN: [id number]  HPI This is a new evaluation for 31 year old woman with a history of SLE followed by Dr. Franki [facility] at [facility] [place].  She is also been seen at [facility] by L Pearsall for restrictive lung disease and possible interstitial lung disease associated with her connective tissue disorder.  She has formerly been on biologic therapy, prednisone. Stopped the biologic in August. Currently she is only on Plaquenil.  She is referred today for evaluation of chronic cough. She began to have dry cough in May 2018 after she went to [place]. Cough non-productive, had some post tussive emesis. Saw her PCP, was treated for pertussis empirically. Improved for 2 weeks and then returned. She was treated with a pred taper > cough resolved temporarily. She was was given empiric Advair for 2 weeks. Cough resolved, and has now stayed gone after a week off the Advair.   No real congestion or drainage, but she does have reflux. Can bother   Chest x-ray 05/22/17 reviewed by me.  Shows no infiltrates, interstitial changes.  CT chest 01/06/16 reviewed by me shows trace bilateral effusions with some left basilar pleural thickening, increased septal thickening at the bilateral bases, no clear evidence for interstitial disease.    Review of Systems  Constitutional: Negative for fever and unexpected weight change.  HENT: Negative for congestion, dental problem, ear pain, nosebleeds, postnasal drip, rhinorrhea, sinus pressure, sneezing, sore throat and trouble swallowing.   Eyes: Negative for redness and itching.  Respiratory: Positive for cough and chest tightness. Negative for shortness of breath and wheezing.   Cardiovascular: Negative for palpitations and leg swelling.  Gastrointestinal: Negative for nausea and vomiting.  Genitourinary: Negative for dysuria.  Musculoskeletal: Negative for joint swelling.  Skin:  Negative for rash.  Neurological: Negative for headaches.  Hematological: Does not bruise/bleed easily.  Psychiatric/Behavioral: Negative for dysphoric mood. The patient is not nervous/anxious.    Past Medical History:  Diagnosis Date  . Lupus      No family history on file.   Social History   Social History  . Marital status: Single    Spouse name: N/A  . Number of children: N/A  . Years of education: N/A   Occupational History  . Not on file.   Social History Main Topics  . Smoking status: Never Smoker  . Smokeless tobacco: Never Used  . Alcohol use Yes  . Drug use: No  . Sexual activity: No   Other Topics Concern  . Not on file   Social History Narrative  . No narrative on file     No Known Allergies   Outpatient Medications Prior to Visit  Medication Sig Dispense Refill  . hydroxychloroquine (PLAQUENIL) 200 MG tablet Take 200 mg by mouth 2 (two) times daily.     Marland Kitchen amLODipine (NORVASC) 5 MG tablet Take by mouth.    . belimumab (BENLYSTA) 120 MG SOLR injection Inject into the vein. Once a month    . Ferrous Fumarate (FERROCITE) 324 (106 Fe) MG TABS tablet     . Olopatadine HCl (PAZEO) 0.7 % SOLN Place 1 drop into both eyes as needed.     . predniSONE (DELTASONE) 20 MG tablet Take 2.5 mg by mouth daily.     . Vitamin D, Ergocalciferol, (DRISDOL) 50000 units CAPS capsule Take by mouth.     No facility-administered medications prior to visit.  Objective:   Physical Exam Vitals:   08/02/17 1013  BP: 114/72  Pulse: 89  SpO2: 99%  Weight: 131 lb (59.4 kg)  Height: 5\' 4"  (1.626 m)   Gen: Pleasant, well-nourished, in no distress,  normal affect  ENT: No lesions,  mouth clear,  oropharynx clear, no postnasal drip  Neck: No JVD, no TMG, no carotid bruits  Lungs: No use of accessory muscles, no dullness to percussion, clear without rales or rhonchi  Cardiovascular: RRR, heart sounds normal, no murmur or gallops, no peripheral  edema  Musculoskeletal: No deformities, no cyanosis or clubbing  Neuro: alert, non focal  Skin: Warm, no lesions or rashes     Assessment & Plan:  Chronic cough A review of her prior evaluation and [facility] shows some restrictive disease but no evidence for obstruction on her prior pulmonary function testing.  She had a chest x-ray 05/22/17 that did not show any interstitial disease.  Based on her history, the off and on nature of her cough, I suspect that this is related to esophageal reflux.  Suspect that her cough will return if the reflux continues.  Empiric therapy would be reasonable at that time.  If she does not respond to treatment for the typical standards of chronic cough then she would merit further evaluation including CT chest to look for evolving infectious process, interstitial disease.  Possible bronchoscopy to evaluate airways.  She will pick up the evaluation with [facility] when she sees them.   It sounds like your cough may be related to esophageal reflux. If your cough returns please try omeprazole 20 mg twice a day.  Take this medication about 30 minutes before eating. Follow-up with [facility] as planned.  If your cough continues even after treatment of common sustaining problems like reflux or nasal drainage then they may decide to do further evaluation.  This could include repeat chest scanning, possible bronchoscopy to inspect your airways. Continue your Plaquenil, lupus medications as managed by Dr. Franki Monte Follow with Dr Lamonte Sakai if needed  Baltazar Apo, MD, PhD 08/02/2017, 10:43 AM [facility] [phone] or if no answer [phone]

## 2018-05-25 IMAGING — CR DG CHEST 2V
2 series · 2 of 2 positions shown · non-contrast
Comparison: Chest x-ray of November 13, 2015 and chest CT scan of
January 06, 2016.

CLINICAL DATA: Chronic cough.  History of lupus.  Nonsmoker.

EXAM:
CHEST  2 VIEW

[w chest pa]
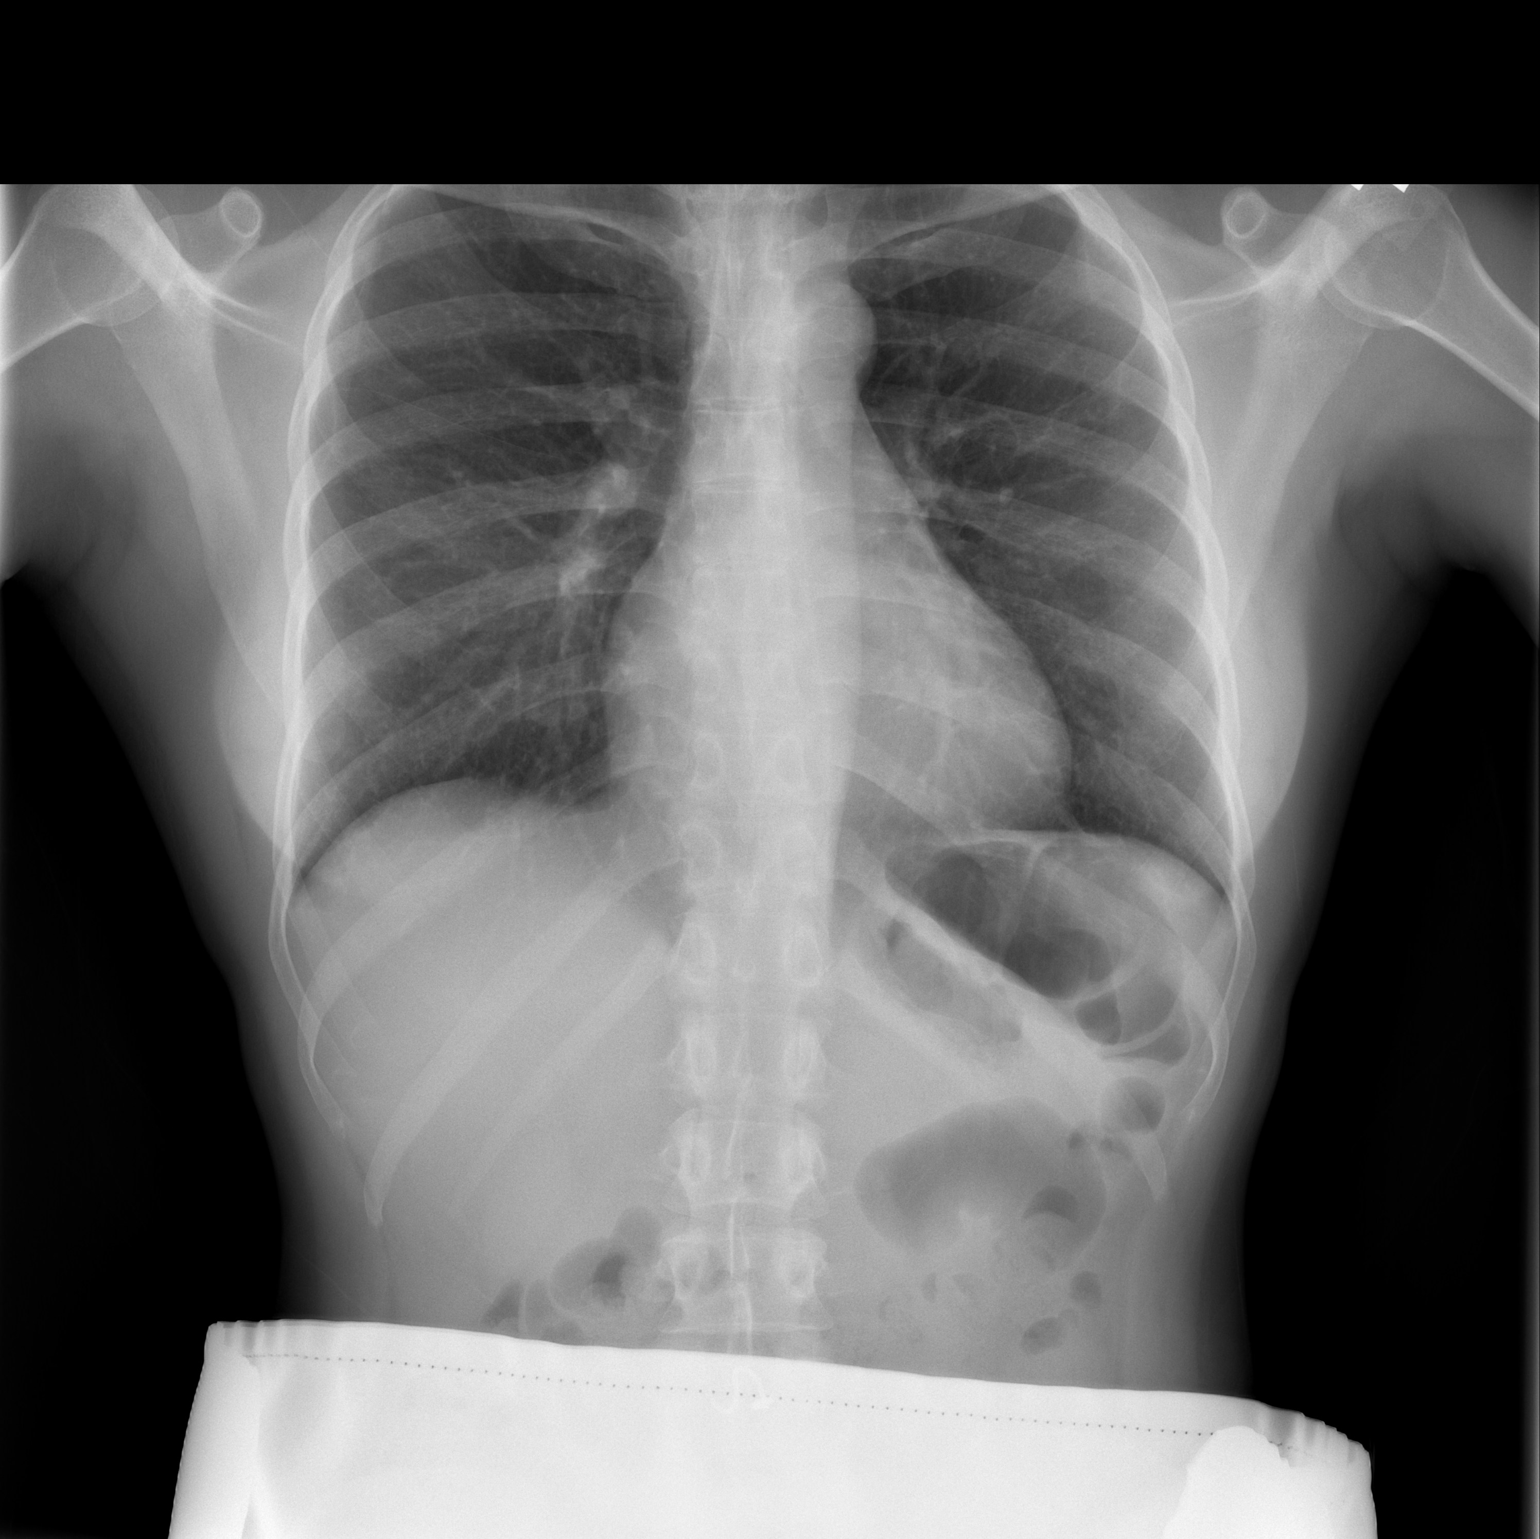

[w chest lat]
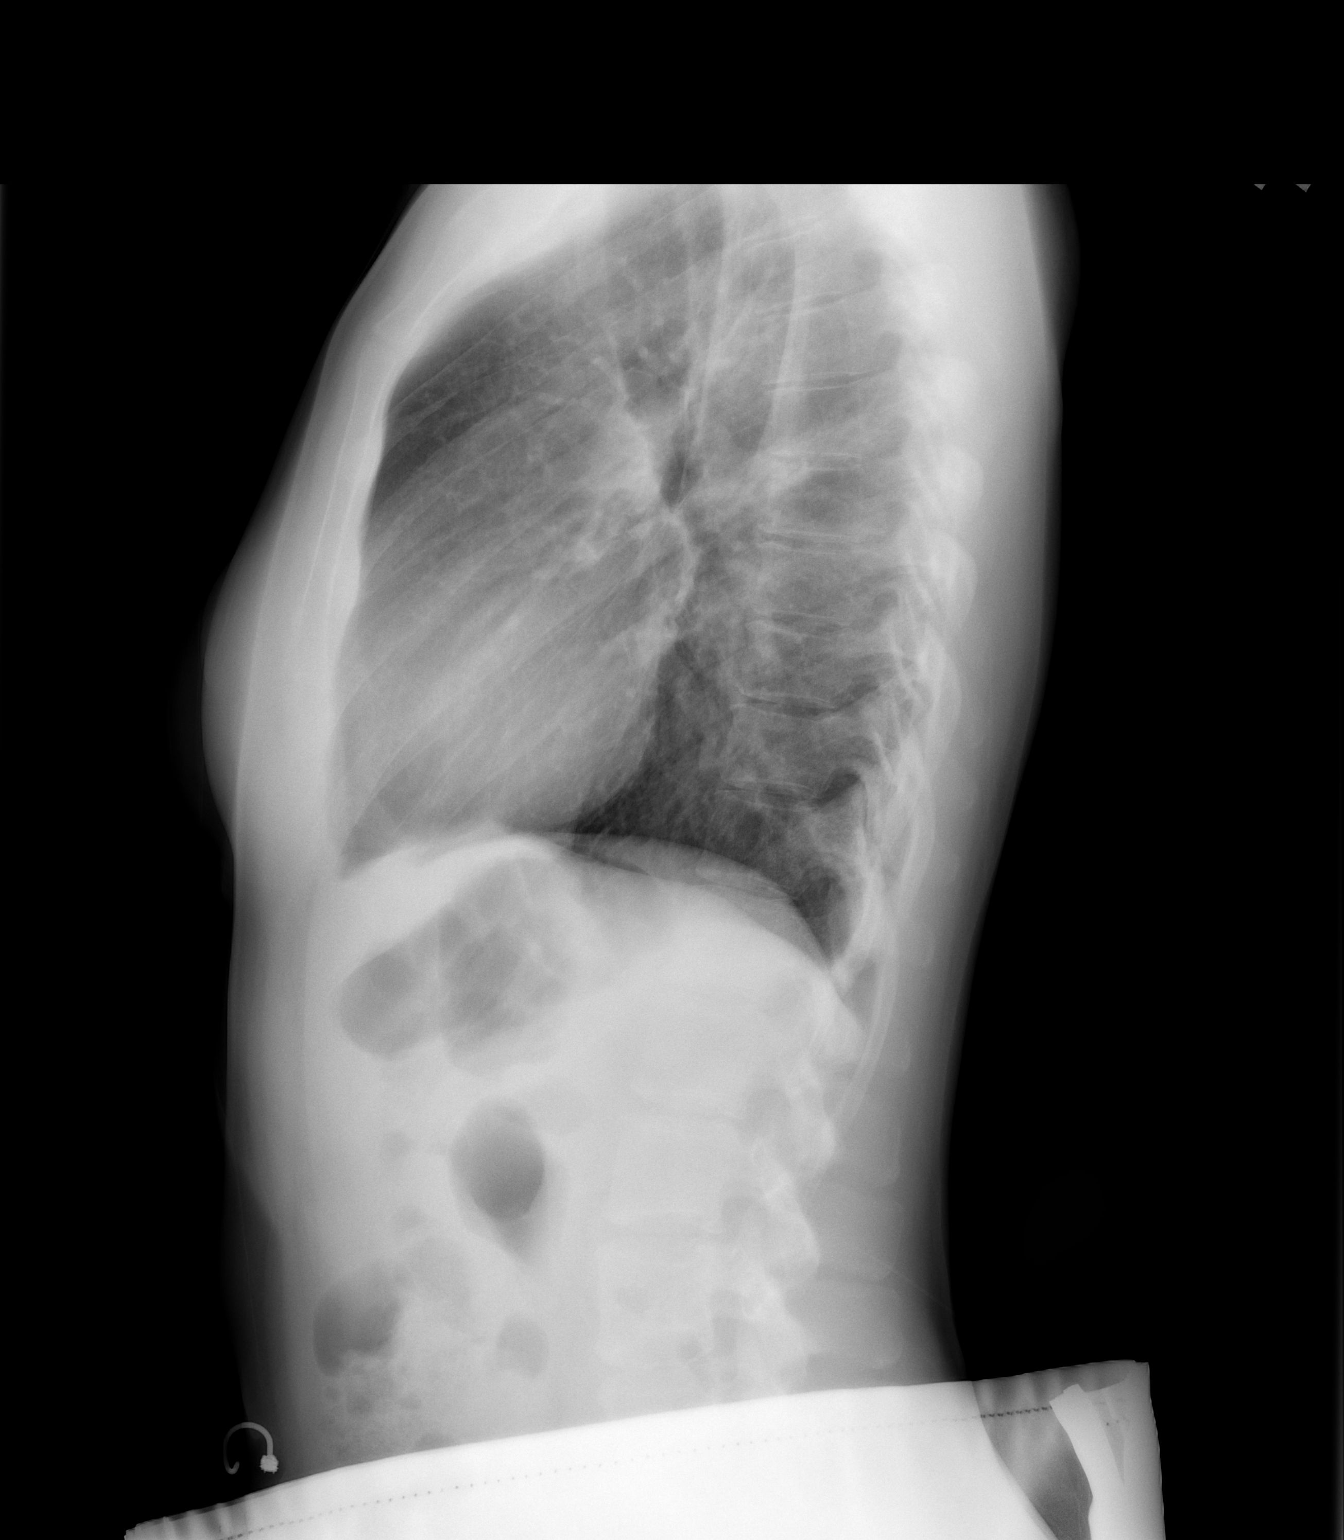

[2 of 2 positions shown; findings below may reference images not displayed]

FINDINGS: The lungs are adequately inflated and clear. The heart and pulmonary
vascularity are normal. The mediastinum is normal in width. There is
no pleural effusion. The trachea is midline. The bony thorax
exhibits no acute abnormality.
IMPRESSION: There is no active cardiopulmonary disease.
